# Patient Record
Sex: Male | Born: 1935 | Race: White | Hispanic: No
Health system: Southern US, Community
[De-identification: ages and names within clinical notes are randomized; demographics above are authoritative.]

---

## 2020-09-24 ENCOUNTER — Other Ambulatory Visit: Payer: Self-pay

## 2020-09-24 ENCOUNTER — Emergency Department (HOSPITAL_COMMUNITY)
Admission: EM | Admit: 2020-09-24 | Discharge: 2020-09-25 | Disposition: A | Discharge status: Home / Self Care | Payer: Medicare Other | Attending: Emergency Medicine | Admitting: Emergency Medicine

## 2020-09-24 ENCOUNTER — Emergency Department (HOSPITAL_COMMUNITY): Payer: Medicare Other

## 2020-09-24 DIAGNOSIS — U071 COVID-19: Secondary | ICD-10-CM | POA: Insufficient documentation

## 2020-09-24 DIAGNOSIS — S0990XA Unspecified injury of head, initial encounter: Secondary | ICD-10-CM | POA: Insufficient documentation

## 2020-09-24 DIAGNOSIS — L089 Local infection of the skin and subcutaneous tissue, unspecified: Secondary | ICD-10-CM | POA: Insufficient documentation

## 2020-09-24 DIAGNOSIS — X58XXXA Exposure to other specified factors, initial encounter: Secondary | ICD-10-CM | POA: Insufficient documentation

## 2020-09-24 DIAGNOSIS — L03114 Cellulitis of left upper limb: Secondary | ICD-10-CM

## 2020-09-24 DIAGNOSIS — M7989 Other specified soft tissue disorders: Secondary | ICD-10-CM | POA: Diagnosis present

## 2020-09-24 LAB — CBC WITH DIFFERENTIAL/PLATELET
Abs Immature Granulocytes: 0.03 10*3/uL (ref 0.00–0.07)
Basophils Absolute: 0.1 10*3/uL (ref 0.0–0.1)
Basophils Relative: 1 %
Eosinophils Absolute: 0.4 10*3/uL (ref 0.0–0.5)
Eosinophils Relative: 3 %
HCT: 35.1 % — ABNORMAL LOW (ref 39.0–52.0)
Hemoglobin: 11.7 g/dL — ABNORMAL LOW (ref 13.0–17.0)
Immature Granulocytes: 0 %
Lymphocytes Relative: 14 %
Lymphs Abs: 1.7 10*3/uL (ref 0.7–4.0)
MCH: 30.4 pg (ref 26.0–34.0)
MCHC: 33.3 g/dL (ref 30.0–36.0)
MCV: 91.2 fL (ref 80.0–100.0)
Monocytes Absolute: 1.2 10*3/uL — ABNORMAL HIGH (ref 0.1–1.0)
Monocytes Relative: 10 %
Neutro Abs: 8.4 10*3/uL — ABNORMAL HIGH (ref 1.7–7.7)
Neutrophils Relative %: 72 %
Platelets: 222 10*3/uL (ref 150–400)
RBC: 3.85 MIL/uL — ABNORMAL LOW (ref 4.22–5.81)
RDW: 13.8 % (ref 11.5–15.5)
WBC: 11.7 10*3/uL — ABNORMAL HIGH (ref 4.0–10.5)
nRBC: 0 % (ref 0.0–0.2)

## 2020-09-24 LAB — BASIC METABOLIC PANEL
Anion gap: 10 (ref 5–15)
BUN: 19 mg/dL (ref 8–23)
CO2: 26 mmol/L (ref 22–32)
Calcium: 8.5 mg/dL — ABNORMAL LOW (ref 8.9–10.3)
Chloride: 102 mmol/L (ref 98–111)
Creatinine, Ser: 0.86 mg/dL (ref 0.61–1.24)
GFR, Estimated: >60 mL/min (ref >60)
Glucose, Bld: 136 mg/dL — ABNORMAL HIGH (ref 70–99)
Potassium: 3.6 mmol/L (ref 3.5–5.1)
Sodium: 138 mmol/L (ref 135–145)

## 2020-09-24 LAB — RESP PANEL BY RT-PCR (FLU A&B, COVID) ARPGX2
Influenza A by PCR: NEGATIVE
Influenza B by PCR: NEGATIVE
SARS Coronavirus 2 by RT PCR: POSITIVE — AB

## 2020-09-24 MED ORDER — LIDOCAINE HCL (PF) 1 % IJ SOLN
5.0000 mL | Freq: Once | INTRAMUSCULAR | Status: AC
  Administered 2020-09-24: 5 mL
  Filled 2020-09-24: qty 5

## 2020-09-24 MED ORDER — METOPROLOL TARTRATE 25 MG PO TABS: 25.0000 mg | ORAL_TABLET | Freq: Once | ORAL | Status: DC

## 2020-09-24 MED ORDER — LORAZEPAM 1 MG PO TABS
1.0000 mg | ORAL_TABLET | Freq: Once | ORAL | Status: AC
  Administered 2020-09-24: 1 mg via ORAL
  Filled 2020-09-24: qty 1

## 2020-09-24 MED ORDER — SULFAMETHOXAZOLE-TRIMETHOPRIM 800-160 MG PO TABS
1.0000 | ORAL_TABLET | Freq: Once | ORAL | Status: AC
  Administered 2020-09-24: 1 via ORAL
  Filled 2020-09-24: qty 1

## 2020-09-24 MED ORDER — HALOPERIDOL LACTATE 5 MG/ML IJ SOLN: 5.0000 mg | Freq: Once | INTRAMUSCULAR | Status: DC

## 2020-09-24 MED ORDER — SULFAMETHOXAZOLE-TRIMETHOPRIM 800-160 MG PO TABS: 1.0000 | ORAL_TABLET | Freq: Two times a day (BID) | ORAL | 0 refills | Status: AC

## 2020-09-24 MED ORDER — BACITRACIN ZINC 500 UNIT/GM EX OINT: 1.0000 "application " | TOPICAL_OINTMENT | Freq: Two times a day (BID) | CUTANEOUS | 0 refills | Status: AC

## 2020-09-24 NOTE — Discharge Instructions (Signed)
You have been seen and discharged from the emergency department.  You appear to have an infection of the right hand.  The infection area was drained.  Take antibiotic twice a day as directed.  Keep the wound dry for the next 24 hours, following this the wounds may get wet and be patted dry but should remained dressed with antibiotic ointment.  Follow-up with your primary provider for reevaluation.  Orthopedics evaluated your hand while you were here, I have provided their contact information for follow-up.  Take home medications as prescribed. If you have any worsening symptoms, worsening hand swelling, redness that tracks up the right arm, high fevers or further concerns for health please return to an emergency department for further evaluation.

## 2020-09-24 NOTE — ED Provider Notes (Signed)
[facility] EMERGENCY DEPARTMENT Provider Note   CSN: [id number] Arrival date & time: 09/24/20  1402     History No chief complaint on file.   Richard Ford is a 87 y.o. male.  Redness swelling and pain to the left hand worsening over the last 2 to 3 days.  Thought to start as a bug bite.  Denies fevers chills.  Denies significant pain but does have pain with range of motion.  Has been draining spontaneously.  He is tried no medications to help.        No past medical history on file.  There are no problems to display for this patient.        No family history on file.     Home Medications Prior to Admission medications   Medication Sig Start Date End Date Taking? Authorizing Provider  bacitracin ointment Apply 1 application topically 2 (two) times daily. 09/24/20  Yes Horton, Clabe Seal, DO  sulfamethoxazole-trimethoprim (BACTRIM DS) 800-160 MG tablet Take 1 tablet by mouth 2 (two) times daily for 7 days. 09/24/20 10/01/20 Yes Horton, Clabe Seal, DO  acetaminophen (TYLENOL) 325 MG tablet Take 650 mg by mouth.    [provider]  aspirin 81 MG chewable tablet Chew 81 mg by mouth daily.    [provider]  atorvastatin (LIPITOR) 40 MG tablet Take 40 mg by mouth daily.    [provider]  cephALEXin (KEFLEX) 500 MG capsule Take 500 mg by mouth 2 (two) times daily.    [provider]  clopidogrel (PLAVIX) 75 MG tablet Take 75 mg by mouth daily.    [provider]  donepezil (ARICEPT) 10 MG tablet Take 10 mg by mouth.    [provider]  finasteride (PROSCAR) 5 MG tablet Take 5 mg by mouth daily.    [provider]  fluticasone (FLONASE) 50 MCG/ACT nasal spray Place into both nostrils.    [provider]  hydrochlorothiazide (HYDRODIURIL) 25 MG tablet Take 25 mg by mouth daily.    [provider]  Lactobacillus (ACIDOPHILUS PROBIOTIC PO) Take 1 tablet by mouth 2 (two) times  daily.    [provider]  loratadine (CLARITIN) 10 MG tablet Take 10 mg by mouth daily.    [provider]  melatonin 3 MG TABS tablet Take 3 mg by mouth at bedtime.    [provider]  metoprolol succinate (TOPROL-XL) 25 MG 24 hr tablet Take 25 mg by mouth at bedtime.    [provider]  polyvinyl alcohol (LIQUIFILM TEARS) 1.4 % ophthalmic solution Place 1 drop into both eyes 2 (two) times daily.    [provider]  sertraline (ZOLOFT) 100 MG tablet Take 100 mg by mouth daily.    [provider]  sodium chloride (OCEAN) 0.65 % SOLN nasal spray Place 2 sprays into both nostrils daily.    [provider]  tamsulosin (FLOMAX) 0.4 MG CAPS capsule Take 0.4 mg by mouth.    [provider]  urea (CARMOL) 20 % cream Apply topically.    [provider]    Allergies    Tetracycline  Review of Systems   Review of Systems  Constitutional: Negative for chills and fever.  HENT: Negative for congestion and rhinorrhea.   Respiratory: Negative for cough and shortness of breath.   Cardiovascular: Negative for chest pain and palpitations.  Gastrointestinal: Negative for diarrhea, nausea and vomiting.  Genitourinary: Negative for difficulty urinating and dysuria.  Musculoskeletal: Positive  for arthralgias and joint swelling. Negative for back pain.  Skin: Positive for color change and wound. Negative for rash.  Neurological: Negative for light-headedness and headaches.    Physical Exam Updated Vital Signs BP (!) 186/88   Pulse 63   Temp 98.2 F (36.8 C) (Oral)   Resp 16   SpO2 95%   Physical Exam Vitals and nursing note reviewed.  Constitutional:      General: He is not in acute distress.    Appearance: Normal appearance.  HENT:     Head: Normocephalic and atraumatic.     Nose: No rhinorrhea.  Eyes:     General:        Right eye: No discharge.        Left eye: No discharge.     Conjunctiva/sclera:  Conjunctivae normal.  Cardiovascular:     Rate and Rhythm: Normal rate and regular rhythm.  Pulmonary:     Effort: Pulmonary effort is normal.     Breath sounds: No stridor.  Abdominal:     General: Abdomen is flat. There is no distension.     Palpations: Abdomen is soft.  Musculoskeletal:        General: Swelling and tenderness present. No deformity or signs of injury.     Comments: Significant redness and swelling about the base of the third digit on the left hand.  Redness tracking into the palmar aspect of the palm and dorsal aspect of the hand.  Tenderness to palpation fingers held in slight flexion.  Purulent drainage from the dorsum of the proximal phalanx of the left finger.  Tenderness with palpation along the flexor sheath  Skin:    General: Skin is warm and dry.  Neurological:     General: No focal deficit present.     Mental Status: He is alert. Mental status is at baseline.     Motor: No weakness.  Psychiatric:        Mood and Affect: Mood normal.        Behavior: Behavior normal.        Thought Content: Thought content normal.     ED Results / Procedures / Treatments   Labs (all labs ordered are listed, but only abnormal results are displayed) Labs Reviewed  RESP PANEL BY RT-PCR (FLU A&B, COVID) ARPGX2 - Abnormal; Notable for the following components:      Result Value   SARS Coronavirus 2 by RT PCR POSITIVE (*)    All other components within normal limits  CBC WITH DIFFERENTIAL/PLATELET - Abnormal; Notable for the following components:   WBC 11.7 (*)    RBC 3.85 (*)    Hemoglobin 11.7 (*)    HCT 35.1 (*)    Neutro Abs 8.4 (*)    Monocytes Absolute 1.2 (*)    All other components within normal limits  BASIC METABOLIC PANEL - Abnormal; Notable for the following components:   Glucose, Bld 136 (*)    Calcium 8.5 (*)    All other components within normal limits  AEROBIC CULTURE W GRAM STAIN (SUPERFICIAL SPECIMEN)    EKG None  Radiology No results  found.  Procedures Procedures   Medications Ordered in ED Medications  lidocaine (PF) (XYLOCAINE) 1 % injection 5 mL (5 mLs Infiltration Given by Other 09/24/20 1748)  sulfamethoxazole-trimethoprim (BACTRIM DS) 800-160 MG per tablet 1 tablet (1 tablet Oral Given 09/24/20 1909)  LORazepam (ATIVAN) tablet 1 mg (1 mg Oral Given 09/24/20 2140)  ziprasidone (GEODON) injection 10 mg (10  mg Intramuscular Given 09/25/20 0329)  sterile water (preservative free) injection (  Given by Other 09/25/20 0350)  hydrALAZINE (APRESOLINE) tablet 10 mg (10 mg Oral Given 09/25/20 6384)    ED Course  I have reviewed the triage vital signs and the nursing notes.  Pertinent labs & imaging results that were available during my care of the patient were reviewed by me and considered in my medical decision making (see chart for details).    MDM Rules/Calculators/A&P                          Concern for abscess versus cellulitis versus flexor tenosynovitis.  Orthopedics consulted.  X-ray reviewed by myself and radiology shows no considerable pathology.  Patient's labs are still pending.  Awaiting orthopedic consultation  Pt care was handed off to on coming provider at 1500.  Complete history and physical and current plan have been communicated.  Please refer to their note for the remainder of ED care and ultimate disposition.  Pt seen in conjunction with Dr. Wilkie Aye  Final Clinical Impression(s) / ED Diagnoses Final diagnoses:  Cellulitis of left upper extremity  COVID-19    Rx / DC Orders ED Discharge Orders         Ordered    sulfamethoxazole-trimethoprim (BACTRIM DS) 800-160 MG tablet  2 times daily        09/24/20 2209    bacitracin ointment  2 times daily        09/24/20 2209           Sabino Donovan, MD 09/27/20 1436

## 2020-09-24 NOTE — Consult Note (Addendum)
Reason for Consult:Left hand infection Referring Physician: Acey Lav Time called: 1443 Time at bedside:    Richard Ford is an 87 y.o. male.  HPI: Richard Ford comes in with a 3d hx/o finger swelling, redness, and pain. He thought he may have gotten bitten by something. He said he had something a month ago that someone "dug into". He does have some dementia so not sure how much of history is accurate. He denies fevers, chills, sweats, N/V, or prior e/o. He lives at a SNF and is RHD. As an aside he told me fell and hit his head in the shower about a month ago with a LOC and has been having HA more frequently ever since.  No past medical history on file.  No family history on file.  Social History:  has no history on file for tobacco use, alcohol use, and drug use.  Allergies: Not on File  Medications: I have reviewed the patient's current medications.  No results found for this or any previous visit (from the past 48 hour(s)).  No results found.  Review of Systems  Constitutional: Negative for chills, diaphoresis and fever.  HENT: Negative for ear discharge, ear pain, hearing loss and tinnitus.   Eyes: Negative for photophobia and pain.  Respiratory: Negative for cough and shortness of breath.   Cardiovascular: Negative for chest pain.  Gastrointestinal: Negative for abdominal pain, nausea and vomiting.  Genitourinary: Negative for dysuria, flank pain, frequency and urgency.  Musculoskeletal: Positive for arthralgias (Left hand). Negative for back pain, myalgias and neck pain.  Neurological: Negative for dizziness and headaches.  Hematological: Does not bruise/bleed easily.  Psychiatric/Behavioral: The patient is not nervous/anxious.    Blood pressure (!) 167/75, pulse (!) 58, temperature 98.2 F (36.8 C), temperature source Oral, resp. rate 16, SpO2 100 %. Physical Exam Constitutional:      General: He is not in acute distress.    Appearance: He is well-developed and  well-nourished. He is not diaphoretic.  HENT:     Head: Normocephalic and atraumatic.  Eyes:     General: No scleral icterus.       Right eye: No discharge.        Left eye: No discharge.     Conjunctiva/sclera: Conjunctivae normal.  Cardiovascular:     Rate and Rhythm: Normal rate and regular rhythm.  Pulmonary:     Effort: Pulmonary effort is normal. No respiratory distress.  Musculoskeletal:     Cervical back: Normal range of motion.     Comments: Left shoulder, elbow, wrist, digits- no skin wounds, nontender, no instability, no blocks to motion  Sens  Ax/R/M/U intact  Mot   Ax/ R/ PIN/ M/ AIN/ U intact  Rad 2+  Skin:    General: Skin is warm and dry.  Neurological:     Mental Status: He is alert.  Psychiatric:        Mood and Affect: Mood and affect normal.        Behavior: Behavior normal.     Assessment/Plan: Left hand infection -- I do not think he has tenosynovitis. Will ask EDPA to I&D at bedside. Can either go home with abx and f/u with Dr. Roney Mans as OP vs admission if EDP thinks that's needed. ?TBI -- May need head CT to r/o mass lesion, will discuss with EDP.    Freeman Caldron, PA-C Orthopedic Surgery [phone] 09/24/2020, 2:58 PM

## 2020-09-24 NOTE — ED Triage Notes (Signed)
Pt brought to ED via EMS from [facility] for left hand swelling following bug bite x 3 days ago. Hx dementia, A&O x 2 at baseline. Pt alert on arrival to ED, redness and swelling present to left hand.  EMS v/s: 116/90 48 pulse 18 RR 95% on room air 97.6 temp

## 2020-09-24 NOTE — ED Notes (Signed)
Pt got out of bed twice and pt said he wanted to see the police so security was called to help settle pt down and get pt back in bed. Pt wanted the door open and it was explained to him that he has covid, which pt denies and isn't understanding d/t dementia. Notified MD, order for ativan obtained. Posey alarm placed on pt bed for safety. TV turned on to help distract pt from what he says are people talking about him in the hallway. Rounding increased. Urinal at bedside, water at bedside, call light at bedside.

## 2020-09-24 NOTE — ED Provider Notes (Signed)
87 year old male presents the emergency department with concern for right hand redness and swelling.  Patient received in signout, please refer to previous physician's note for full history/physical exam.  Orthopedics was consulted for concern of hand swelling and infection. Physical Exam  BP (!) 158/99   Pulse (!) 57   Temp 98.2 F (36.8 C) (Oral)   Resp 20   SpO2 99%   Physical Exam Vitals and nursing note reviewed.  Constitutional:      Appearance: Normal appearance.  HENT:     Head: Normocephalic.     Mouth/Throat:     Mouth: Mucous membranes are moist.  Pulmonary:     Effort: Pulmonary effort is normal. No respiratory distress.  Musculoskeletal:     Comments: The proximal right middle finger on the dorsal aspect has a swollen, red and fluctuant area with some purulent drainage, swelling extends to the second through third knuckle with some mild edema and redness of the posterior part of the right hand, no significant findings on the palmar aspect, normal capillary refill and pulses in the right hand.  Skin:    General: Skin is warm.  Neurological:     Mental Status: He is alert and oriented to person, place, and time. Mental status is at baseline.  Psychiatric:        Mood and Affect: Mood normal.     ED Course/Procedures     Procedures  MDM  Orthopedics has evaluated the patient.  They have no concern for tenosynovitis or anything surgical at this time.  Patient is afebrile, blood work shows a mild leukocytosis but no findings of severe infection/sepsis.  Orthopedics recommended I&D of the posterior part of the right middle finger, this was performed, got moderate amount of purulent and serosanguineous drainage.  This also decrease the swelling over the knuckles.  Of note patient was found to be Covid positive.  He has no symptoms in regards to this.  Denies any chest pain, shortness of breath.  He has no hypoxia or respiratory distress.  Since he has had no symptoms unclear  of what his course would be in the illness.  We will give him outpatient follow-up.  He is not acutely ill or requiring emergent treatment/admission.  In regards to the hand I believe antibiotics and outpatient follow-up is appropriate given the improvement with the I&D and his lack of findings in regards to sepsis.  Patient does have dementia, he has started sundowning, did require 1 dose of Ativan for agitation but as of now will be discharged back to his facility.       Rozelle Logan, DO 09/24/20 2235

## 2020-09-24 NOTE — ED Notes (Signed)
Pt resting comfortably in the bed at this time

## 2020-09-24 NOTE — ED Notes (Signed)
Patient transported to x-ray. ?

## 2020-09-25 DIAGNOSIS — L089 Local infection of the skin and subcutaneous tissue, unspecified: Secondary | ICD-10-CM | POA: Diagnosis not present

## 2020-09-25 MED ORDER — HYDRALAZINE HCL 10 MG PO TABS
10.0000 mg | ORAL_TABLET | Freq: Once | ORAL | Status: AC
  Administered 2020-09-25: 10 mg via ORAL
  Filled 2020-09-25: qty 1

## 2020-09-25 MED ORDER — HYDROCHLOROTHIAZIDE 25 MG PO TABS
25.0000 mg | ORAL_TABLET | Freq: Every day | ORAL | Status: DC
  Administered 2020-09-25: 25 mg via ORAL
  Filled 2020-09-25 (×2): qty 1

## 2020-09-25 MED ORDER — ZIPRASIDONE MESYLATE 20 MG IM SOLR
10.0000 mg | Freq: Once | INTRAMUSCULAR | Status: AC
  Administered 2020-09-25: 10 mg via INTRAMUSCULAR
  Filled 2020-09-25: qty 20

## 2020-09-25 MED ORDER — STERILE WATER FOR INJECTION IJ SOLN
INTRAMUSCULAR | Status: AC
  Filled 2020-09-25: qty 20

## 2020-09-25 NOTE — ED Notes (Signed)
Pt changed, brief replaced.

## 2020-09-25 NOTE — ED Notes (Signed)
Pt discharged with PT[facility] back to [facility] facility.

## 2020-09-27 LAB — AEROBIC CULTURE W GRAM STAIN (SUPERFICIAL SPECIMEN): Gram Stain: NONE SEEN

## 2020-11-11 ENCOUNTER — Emergency Department (HOSPITAL_COMMUNITY): Payer: Medicare Other

## 2020-11-11 ENCOUNTER — Emergency Department (HOSPITAL_COMMUNITY)
Admission: EM | Admit: 2020-11-11 | Discharge: 2020-11-11 | Disposition: A | Discharge status: Home / Self Care | Payer: Medicare Other | Attending: Emergency Medicine | Admitting: Emergency Medicine

## 2020-11-11 DIAGNOSIS — F0390 Unspecified dementia without behavioral disturbance: Secondary | ICD-10-CM | POA: Insufficient documentation

## 2020-11-11 DIAGNOSIS — R410 Disorientation, unspecified: Secondary | ICD-10-CM | POA: Insufficient documentation

## 2020-11-11 DIAGNOSIS — S0990XA Unspecified injury of head, initial encounter: Secondary | ICD-10-CM | POA: Insufficient documentation

## 2020-11-11 DIAGNOSIS — Y92129 Unspecified place in nursing home as the place of occurrence of the external cause: Secondary | ICD-10-CM | POA: Diagnosis not present

## 2020-11-11 DIAGNOSIS — S0081XA Abrasion of other part of head, initial encounter: Secondary | ICD-10-CM | POA: Insufficient documentation

## 2020-11-11 DIAGNOSIS — Z7902 Long term (current) use of antithrombotics/antiplatelets: Secondary | ICD-10-CM | POA: Insufficient documentation

## 2020-11-11 DIAGNOSIS — W19XXXA Unspecified fall, initial encounter: Secondary | ICD-10-CM | POA: Insufficient documentation

## 2020-11-11 NOTE — ED Notes (Addendum)
Pt d/c by MD and is provided w/ d/cancer instructions and transported back to [facility]

## 2020-11-11 NOTE — ED Notes (Signed)
PTAR CALLED BY Hershell Brandl  PT IS NUMBER [phone] ON THE LIST

## 2020-11-11 NOTE — ED Triage Notes (Addendum)
Pt arrives from EMS from [facility] memory care, EMS reports pt was walking without his walker and was found on the floor. EMS reports pt is taking plavix and his mental baseline is confusion. Upon arrival pt has an skin tear above right eyebrow, bleeding controlled, Pt denies any complaints or pain at this time, pt able to move all extremities, Vitals signs stable at this time

## 2020-11-11 NOTE — ED Provider Notes (Signed)
Emergency Department Provider Note   I have reviewed the triage vital signs and the nursing notes.   HISTORY  Chief Complaint Fall   HPI Richard Ford is a 87 y.o. male with past medical history reviewed in his duplicate chart including hypertension and dementia presents to the emergency department from memory care unit after fall.  He is supposed to ambulate with a walker but was not in had a fall reportedly witnessed by staff.  There was no loss of consciousness.  He was noted to have some abrasion over the right eye by EMS but he is at his baseline mental status which is pleasant but confused.  Patient denies any pain.  He cannot provide any additional history regarding the fall. Level 5 caveat: Dementia.    Patient is on Plavix and per protocol was activated as a level 2 trauma by EMS.  No past medical history on file.  There are no problems to display for this patient.  Allergies Patient has no known allergies.  No family history on file.  Social History    Review of Systems  Constitutional: No fever/chills Eyes: No visual changes. ENT: No sore throat. Cardiovascular: Denies chest pain. Respiratory: Denies shortness of breath. Gastrointestinal: No abdominal pain.  No nausea, no vomiting.  No diarrhea.  No constipation. Genitourinary: Negative for dysuria. Musculoskeletal: Negative for back pain. Skin: Negative for rash. Neurological: Negative for headaches, focal weakness or numbness.  10-point ROS otherwise negative.  ____________________________________________   PHYSICAL EXAM:  VITAL SIGNS: ED Triage Vitals  Enc Vitals Group     BP 11/11/20 1621 (!) 162/76     Pulse Rate 11/11/20 1621 65     Resp 11/11/20 1621 20     Temp 11/11/20 1621 98.2 F (36.8 C)     Temp src --      SpO2 11/11/20 1621 97 %     Weight 11/11/20 1621 172 lb (78 kg)     Height 11/11/20 1621 5\' 10"  (1.778 m)   Constitutional: Alert and oriented. Well appearing and in no  acute distress. Eyes: Conjunctivae are normal. Pupils are 1 mm and reactive bilaterally.  Head: Abrasion over the right eyebrow without laceration.  No hematoma. Nose: No congestion/rhinnorhea. Mouth/Throat: Mucous membranes are moist.  Oropharynx non-erythematous. Neck: No stridor.  C collar in place.  Cardiovascular: Normal rate, regular rhythm. Good peripheral circulation. Grossly normal heart sounds.   Respiratory: Normal respiratory effort.  No retractions. Lungs CTAB. Gastrointestinal: Soft and nontender. No distention.  Musculoskeletal: No lower extremity tenderness nor edema. No gross deformities of extremities.  Normal active and passive range of motion in all upper and lower extremities.  Neurologic:  Normal speech and language. No gross focal neurologic deficits are appreciated. 5/5 strength in the bilateral upper and lower extremities. Skin:  Skin is warm and dry.  Abrasion over the right eyebrow which is hemostatic.  No laceration.  ____________________________________________  EKG   EKG Interpretation  Date/Time:  Thursday November 11 2020 16:27:13 EDT Ventricular Rate:  66 PR Interval:  238 QRS Duration: 109 QT Interval:  460 QTC Calculation: 482 R Axis:   6 Text Interpretation: Sinus rhythm Prolonged PR interval Anterior infarct, old Confirmed by [birth year]1981 [phone]) on 11/11/2020 5:34:21 PM       ____________________________________________  RADIOLOGY  CT Head Wo Contrast  Result Date: 11/11/2020 CLINICAL DATA:  Head trauma. EXAM: CT HEAD WITHOUT CONTRAST TECHNIQUE: Contiguous axial images were obtained from the base of the skull through the  vertex without intravenous contrast. COMPARISON:  September 24, 2020 FINDINGS: Brain: No evidence of acute infarction, hemorrhage, hydrocephalus, extra-axial collection or mass lesion/mass effect. Moderate brain parenchymal volume loss and deep white matter microangiopathy. Vascular: Calcific atherosclerotic disease of the intra  cranial arteries. Skull: Normal. Negative for fracture or focal lesion. Sinuses/Orbits: No acute finding. Other: None. IMPRESSION: 1. No acute intracranial abnormality. 2. Moderate brain parenchymal atrophy and chronic microvascular disease. Electronically Signed   By: Ted Mcalpine M.D.   On: 11/11/2020 16:58   CT Cervical Spine Wo Contrast  Result Date: 11/11/2020 CLINICAL DATA:  Tripped and fell, hit head, anticoagulated EXAM: CT CERVICAL SPINE WITHOUT CONTRAST TECHNIQUE: Multidetector CT imaging of the cervical spine was performed without intravenous contrast. Multiplanar CT image reconstructions were also generated. COMPARISON:  None. FINDINGS: Alignment: Loss of normal cervical lordosis due to extensive multilevel spondylosis and facet hypertrophy. Skull base and vertebrae: No acute fracture. No primary bone lesion or focal pathologic process. Soft tissues and spinal canal: No prevertebral fluid or swelling. No visible canal hematoma. Extensive atherosclerosis. Disc levels: There is diffuse spondylosis and facet hypertrophy throughout the entire cervical spine, most pronounced at the C4-5, C5-6, and C6-7 levels. Marked hypertrophic changes at the C1-C2 interface. Upper chest: Airway is patent.  Lung apices are clear. Other: Reconstructed images demonstrate no additional findings. IMPRESSION: 1. Extensive multilevel cervical spondylosis and facet hypertrophy. 2. No acute fracture. 3. Atherosclerosis. Electronically Signed   By: Sharlet Salina M.D.   On: 11/11/2020 16:53   DG Chest Portable 1 View  Result Date: 11/11/2020 CLINICAL DATA:  87 year old who fell and complains of chest pain. EXAM: PORTABLE CHEST 1 VIEW COMPARISON:  None. FINDINGS: Prior sternotomy. Prior TAVR. Cardiac silhouette moderately enlarged for AP portable technique. Thoracic aorta tortuous and mildly atherosclerotic. Hilar and mediastinal contours otherwise unremarkable. Lungs clear. Bronchovascular markings normal. Pulmonary  vascularity normal. No visible pleural effusions. No pneumothorax. Osseous demineralization. Degenerative changes in the shoulders. IMPRESSION: Cardiomegaly. No acute cardiopulmonary disease. Electronically Signed   By: Hulan Saas M.D.   On: 11/11/2020 16:32    ____________________________________________   PROCEDURES  Procedure(s) performed:   Procedures  None  ____________________________________________   INITIAL IMPRESSION / ASSESSMENT AND PLAN / ED COURSE  Pertinent labs & imaging results that were available during my care of the patient were reviewed by me and considered in my medical decision making (see chart for details).   Patient presents to the emergency department for evaluation and with head injury after fall while not using his walker.  He has an abrasion above the right eye but no laceration requiring repair.  He appears to be at his mental status baseline and has equal strength and sensation in the bilateral upper and lower extremities.  Vital signs show hypertension but in his merged chart it appears that he has elevated blood pressures frequently.  CT imaging of the head and cervical spine show chronic microvascular disease but no acute findings such as bleeding or fracture.  Chest x-ray is similar and that it shows chronic cardiomegaly but no acute cardiopulmonary disease.  Plan for patient to be discharged back to his nursing facility.  I attended to contact his family member, Richard Ford, by the number listed in the chart but there was no answer.    ____________________________________________  FINAL CLINICAL IMPRESSION(S) / ED DIAGNOSES  Final diagnoses:  Fall, initial encounter  Injury of head, initial encounter  Abrasion of forehead, initial encounter    Note:  This document was prepared  using Conservation officer, historic buildings and may include unintentional dictation errors.  Alona Bene, MD, [facility] Emergency Medicine    Nikea Settle, Arlyss Repress, MD 11/11/20  1739

## 2020-11-11 NOTE — Discharge Instructions (Signed)
You were seen in the emergency department today after a fall.  The CT scans of your head and neck were normal.  You do have an abrasion over your right eyebrow.  You need to keep this clean and dry.  It did not require stitches or glue.  Please follow with your primary care doctor and return to the emergency department with any sudden confusion, vomiting, other sudden/severe symptoms.

## 2020-11-11 NOTE — ED Notes (Signed)
Patient transported to CT Accompany by TRN

## 2020-11-11 NOTE — ED Notes (Signed)
This RN clean dry blood off patient and place dressing on pt skin tear and provided food and drinks for patient, call bell within reach

## 2020-11-27 ENCOUNTER — Emergency Department (HOSPITAL_COMMUNITY): Payer: Medicare Other

## 2020-11-27 ENCOUNTER — Encounter (HOSPITAL_COMMUNITY): Payer: Self-pay

## 2020-11-27 ENCOUNTER — Emergency Department (HOSPITAL_COMMUNITY)
Admission: EM | Admit: 2020-11-27 | Discharge: 2020-11-27 | Disposition: A | Discharge status: Home / Self Care | Payer: Medicare Other | Attending: Emergency Medicine | Admitting: Emergency Medicine

## 2020-11-27 DIAGNOSIS — S61412A Laceration without foreign body of left hand, initial encounter: Secondary | ICD-10-CM | POA: Insufficient documentation

## 2020-11-27 DIAGNOSIS — F0390 Unspecified dementia without behavioral disturbance: Secondary | ICD-10-CM | POA: Diagnosis not present

## 2020-11-27 DIAGNOSIS — W2209XA Striking against other stationary object, initial encounter: Secondary | ICD-10-CM | POA: Insufficient documentation

## 2020-11-27 DIAGNOSIS — S6992XA Unspecified injury of left wrist, hand and finger(s), initial encounter: Secondary | ICD-10-CM | POA: Diagnosis present

## 2020-11-27 DIAGNOSIS — Z7982 Long term (current) use of aspirin: Secondary | ICD-10-CM | POA: Diagnosis not present

## 2020-11-27 DIAGNOSIS — Z23 Encounter for immunization: Secondary | ICD-10-CM | POA: Insufficient documentation

## 2020-11-27 DIAGNOSIS — Z7902 Long term (current) use of antithrombotics/antiplatelets: Secondary | ICD-10-CM | POA: Insufficient documentation

## 2020-11-27 MED ORDER — TETANUS-DIPHTH-ACELL PERTUSSIS 5-2.5-18.5 LF-MCG/0.5 IM SUSY
0.5000 mL | PREFILLED_SYRINGE | Freq: Once | INTRAMUSCULAR | Status: AC
  Administered 2020-11-27: 0.5 mL via INTRAMUSCULAR
  Filled 2020-11-27: qty 0.5

## 2020-11-27 NOTE — Discharge Instructions (Addendum)
The Steri-Strips should fall off on their own after few days.  Try not to get them wet or peel them off as this will cause the Steri-Strips to fall off sooner. Use Tylenol as needed for pain.  Use ice to help with pain and swelling. Continue taking home medications as prescribed. Your tetanus was updated today in the department. Follow-up with your primary care doctor today for recheck of your symptoms. Return to the emergency room with any new, sudden, concerning symptoms.

## 2020-11-27 NOTE — ED Notes (Signed)
Richard Ford Med tech answered [facility] call; report given to Richard Ford who was made aware pt will be returning.

## 2020-11-27 NOTE — ED Notes (Signed)
Called [facility] to inform staff that patient will be getting ptar transport back to facility with no answer x2 and no ability to leave voicemail.

## 2020-11-27 NOTE — ED Provider Notes (Signed)
[facility]-EMERGENCY DEPT Provider Note   CSN: [id number] Arrival date & time: 11/27/20  1218     History Chief Complaint  Patient presents with  . Wound Check    Richard Ford is a 87 y.o. male presenting for evaluation of left hand injury.  Level 5 caveat due to dementia.  History obtained from triage note.  Per note, patient hit his left hand on something during lunch.  Developed bleeding and had a skin tear in the area.  Per chart review, patient is on Plavix.  No other blood thinners.  Pt denies pain.   HPI     History reviewed. No pertinent past medical history.  There are no problems to display for this patient.   History reviewed. No pertinent surgical history.     History reviewed. No pertinent family history.     Home Medications Prior to Admission medications   Medication Sig Start Date End Date Taking? Authorizing Provider  acetaminophen (TYLENOL) 325 MG tablet Take 650 mg by mouth.    [provider]  aspirin 81 MG chewable tablet Chew 81 mg by mouth daily.    [provider]  atorvastatin (LIPITOR) 40 MG tablet Take 40 mg by mouth daily.    [provider]  bacitracin ointment Apply 1 application topically 2 (two) times daily. 09/24/20   Horton, Clabe Seal, DO  cephALEXin (KEFLEX) 500 MG capsule Take 500 mg by mouth 2 (two) times daily.    [provider]  clopidogrel (PLAVIX) 75 MG tablet Take 75 mg by mouth daily.    [provider]  donepezil (ARICEPT) 10 MG tablet Take 10 mg by mouth.    [provider]  finasteride (PROSCAR) 5 MG tablet Take 5 mg by mouth daily.    [provider]  fluticasone (FLONASE) 50 MCG/ACT nasal spray Place into both nostrils.    [provider]  hydrochlorothiazide (HYDRODIURIL) 25 MG tablet Take 25 mg by mouth daily.    [provider]  Lactobacillus (ACIDOPHILUS PROBIOTIC PO) Take 1 tablet by mouth 2 (two) times daily.     [provider]  loratadine (CLARITIN) 10 MG tablet Take 10 mg by mouth daily.    [provider]  melatonin 3 MG TABS tablet Take 3 mg by mouth at bedtime.    [provider]  metoprolol succinate (TOPROL-XL) 25 MG 24 hr tablet Take 25 mg by mouth at bedtime.    [provider]  polyvinyl alcohol (LIQUIFILM TEARS) 1.4 % ophthalmic solution Place 1 drop into both eyes 2 (two) times daily.    [provider]  sertraline (ZOLOFT) 100 MG tablet Take 100 mg by mouth daily.    [provider]  sodium chloride (OCEAN) 0.65 % SOLN nasal spray Place 2 sprays into both nostrils daily.    [provider]  tamsulosin (FLOMAX) 0.4 MG CAPS capsule Take 0.4 mg by mouth.    [provider]  urea (CARMOL) 20 % cream Apply topically.    [provider]    Allergies    Tetracycline  Review of Systems   Review of Systems  Skin: Positive for wound.  Hematological: Bruises/bleeds easily.    Physical Exam Updated Vital Signs BP (!) 151/78   Pulse (!) 50   Temp 98.3 F (36.8 C) (Oral)   Resp 14   Ht 5\' 9"  (1.753 m)   Wt 79.4 kg   SpO2 97%   BMI 25.84 kg/m  Physical Exam Vitals and nursing note reviewed.  Constitutional:      General: He is not in acute distress.    Appearance: He is well-developed.     Comments: Resting in the bed in no acute distress  HENT:     Head: Normocephalic and atraumatic.  Eyes:     Conjunctiva/sclera: Conjunctivae normal.     Pupils: Pupils are equal, round, and reactive to light.  Cardiovascular:     Rate and Rhythm: Normal rate and regular rhythm.     Pulses: Normal pulses.  Pulmonary:     Effort: Pulmonary effort is normal. No respiratory distress.     Breath sounds: Normal breath sounds. No wheezing.  Abdominal:     General: There is no distension.     Palpations: Abdomen is soft. There is no mass.     Tenderness: There is no abdominal tenderness. There is no guarding or  rebound.  Musculoskeletal:        General: Normal range of motion.     Cervical back: Normal range of motion and neck supple.     Comments: Skin tear of the dorsal left hand without active bleeding. underlying hematoma. Full active range of motion of the wrist and fingers.  Good distal sensation and cap refill of all fingers.  Skin:    General: Skin is warm and dry.     Capillary Refill: Capillary refill takes less than 2 seconds.  Neurological:     Mental Status: He is alert and oriented to person, place, and time.        ED Results / Procedures / Treatments   Labs (all labs ordered are listed, but only abnormal results are displayed) Labs Reviewed - No data to display  EKG None  Radiology DG Hand Complete Left  Result Date: 11/27/2020 CLINICAL DATA:  Skin tear on the dorsal aspect of the left hand. EXAM: LEFT HAND - COMPLETE 3+ VIEW COMPARISON:  09/24/2020 FINDINGS: Again noted are diffuse degenerative changes throughout the left hand without acute fracture or dislocation. Severe osteoarthritis at the first carpometacarpal joint and the fourth MCP joint. Ulnar positive variance and similar to the previous examination. Diffuse vascular calcifications. 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  No acute abnormality. Electronically Signed   By: Richarda Overlie M.D.   On: 11/27/2020 13:46    Procedures .Marland KitchenLaceration Repair  Date/Time: 11/27/2020 2:04 PM Performed by: Wynetta Fines, MD Authorized by: Alveria Apley, PA-C   Consent:    Consent obtained:  Verbal   Consent given by:  Patient Anesthesia:    Anesthesia method:  None Treatment:    Area cleansed with:  Soap and water Skin repair:    Repair method:  Steri-Strips   Number of Steri-Strips:  5 Approximation:    Approximation:  Close Repair type:    Repair type:  Simple Post-procedure details:    Dressing:  Sterile dressing   Procedure completion:  Tolerated well, no immediate complications      Medications Ordered in ED Medications  Tdap (BOOSTRIX) injection 0.5 mL (has no administration in time range)    ED Course  I have reviewed the triage vital signs and the nursing notes.  Pertinent labs & imaging results that were available during my care of the patient were reviewed by me and considered in my medical decision making (see chart for details).    MDM Rules/Calculators/A&P  Patient presented for evaluation of hand injury.  On exam, patient appears neurovascularly intact.  Due to his history of dementia and unreliable H&P, will obtain x-ray to ensure no bony abnormality.  If negative, plan for repair of skin tear with Steri-Strips.  I attempted to call the facility for more information about why patient was sent over, but was unable to get in touch with the staff that cares for the patient.  X-ray viewed and independently interpreted by me, no fracture or dislocation.  Laceration repaired as described above.  Tetanus updated in the department.  At this time, patient present for discharge.  Return precautions given.  Patient states he understands and agrees to plan.  Final Clinical Impression(s) / ED Diagnoses Final diagnoses:  Skin tear of left hand without complication, initial encounter    Rx / DC Orders ED Discharge Orders    None       Alveria Apley, PA-C 11/27/20 1406    Wynetta Fines, MD 11/27/20 1419

## 2020-11-27 NOTE — ED Notes (Signed)
Called [facility] nursing facility. Spoke to Norco and informed her patient is on the way.

## 2020-11-27 NOTE — ED Notes (Signed)
Provided food and drink. Awaiting ptar transport

## 2020-11-27 NOTE — ED Triage Notes (Addendum)
Sent by EMS from [facility] care. As per EMS pt was in lunch room and hit his L hand on "something". Skin tear found on L hand bleeding controlled by EMS. As per EMS pt was agitated at facility, given Xanex at 1130. Pt calm and cooperative at this time.

## 2020-11-27 NOTE — ED Notes (Signed)
ptar contacted for pt transport back to [facility].

## 2020-11-27 NOTE — ED Notes (Addendum)
Patient attempting to get out of bed 4 times since shift change at 1900, despite verbal deescalating technique. The 4th time the patient made it so his legs can dangle off the bed. Bed alarm is on, volume turned up all the way. Dr. Adela Lank put in order for posey belt.

## 2020-12-01 ENCOUNTER — Other Ambulatory Visit: Payer: Self-pay

## 2020-12-01 ENCOUNTER — Emergency Department (HOSPITAL_COMMUNITY): Payer: Medicare Other

## 2020-12-01 ENCOUNTER — Emergency Department (HOSPITAL_COMMUNITY)
Admission: EM | Admit: 2020-12-01 | Discharge: 2020-12-01 | Disposition: A | Discharge status: Home / Self Care | Payer: Medicare Other | Attending: Emergency Medicine | Admitting: Emergency Medicine

## 2020-12-01 DIAGNOSIS — W19XXXA Unspecified fall, initial encounter: Secondary | ICD-10-CM

## 2020-12-01 DIAGNOSIS — F0390 Unspecified dementia without behavioral disturbance: Secondary | ICD-10-CM | POA: Insufficient documentation

## 2020-12-01 DIAGNOSIS — Y92129 Unspecified place in nursing home as the place of occurrence of the external cause: Secondary | ICD-10-CM | POA: Diagnosis not present

## 2020-12-01 DIAGNOSIS — S0990XA Unspecified injury of head, initial encounter: Secondary | ICD-10-CM | POA: Diagnosis present

## 2020-12-01 DIAGNOSIS — T148XXA Other injury of unspecified body region, initial encounter: Secondary | ICD-10-CM

## 2020-12-01 DIAGNOSIS — S0181XA Laceration without foreign body of other part of head, initial encounter: Secondary | ICD-10-CM | POA: Diagnosis not present

## 2020-12-01 LAB — BASIC METABOLIC PANEL
Anion gap: 4 — ABNORMAL LOW (ref 5–15)
BUN: 18 mg/dL (ref 8–23)
CO2: 27 mmol/L (ref 22–32)
Calcium: 8.5 mg/dL — ABNORMAL LOW (ref 8.9–10.3)
Chloride: 107 mmol/L (ref 98–111)
Creatinine, Ser: 0.7 mg/dL (ref 0.61–1.24)
GFR, Estimated: >60 mL/min (ref >60)
Glucose, Bld: 138 mg/dL — ABNORMAL HIGH (ref 70–99)
Potassium: 3.4 mmol/L — ABNORMAL LOW (ref 3.5–5.1)
Sodium: 138 mmol/L (ref 135–145)

## 2020-12-01 LAB — CBC WITH DIFFERENTIAL/PLATELET
Abs Immature Granulocytes: 0.04 10*3/uL (ref 0.00–0.07)
Basophils Absolute: 0 10*3/uL (ref 0.0–0.1)
Basophils Relative: 1 %
Eosinophils Absolute: 0.5 10*3/uL (ref 0.0–0.5)
Eosinophils Relative: 7 %
HCT: 37.7 % — ABNORMAL LOW (ref 39.0–52.0)
Hemoglobin: 12 g/dL — ABNORMAL LOW (ref 13.0–17.0)
Immature Granulocytes: 1 %
Lymphocytes Relative: 18 %
Lymphs Abs: 1.4 10*3/uL (ref 0.7–4.0)
MCH: 29 pg (ref 26.0–34.0)
MCHC: 31.8 g/dL (ref 30.0–36.0)
MCV: 91.1 fL (ref 80.0–100.0)
Monocytes Absolute: 0.7 10*3/uL (ref 0.1–1.0)
Monocytes Relative: 9 %
Neutro Abs: 4.9 10*3/uL (ref 1.7–7.7)
Neutrophils Relative %: 64 %
Platelets: 220 10*3/uL (ref 150–400)
RBC: 4.14 MIL/uL — ABNORMAL LOW (ref 4.22–5.81)
RDW: 15.4 % (ref 11.5–15.5)
WBC: 7.6 10*3/uL (ref 4.0–10.5)
nRBC: 0 % (ref 0.0–0.2)

## 2020-12-01 MED ORDER — POTASSIUM CHLORIDE CRYS ER 20 MEQ PO TBCR
40.0000 meq | EXTENDED_RELEASE_TABLET | Freq: Once | ORAL | Status: AC
  Administered 2020-12-01: 40 meq via ORAL
  Filled 2020-12-01: qty 2

## 2020-12-01 MED ORDER — HYDROCHLOROTHIAZIDE 25 MG PO TABS
25.0000 mg | ORAL_TABLET | Freq: Once | ORAL | Status: AC
  Administered 2020-12-01: 25 mg via ORAL
  Filled 2020-12-01: qty 1

## 2020-12-01 NOTE — ED Provider Notes (Signed)
Care transferred to me.  Labs including anemia are near baseline.  We will replete potassium.  Head CT and cervical spine CT are benign.  Will discharge back to facility.  I tried to call his daughter but it went straight to voicemail.`   Pricilla Loveless, MD 12/01/20 0830

## 2020-12-01 NOTE — ED Notes (Signed)
Wound cleanse, dressed. Xeroform placed with verbal order from Dr. Pricilla Loveless.

## 2020-12-01 NOTE — ED Provider Notes (Signed)
[facility]  CSN: [id number] Arrival date & time: 12/01/20 [id number]  Chief Complaint(s) Level II Fall   HPI Helane RimaRaymond Bertz is a 87 y.o. male here from [facility] for fall from his wheelchair. + head trauma. No LOC. On Plavix. H/o dementia. At baseline. Complains of frontal headache. Denies any other pain.  Remainder of history, ROS, and physical exam limited due to patient's condition (dementia). Additional information was obtained from EMS.   Level V Caveat.    HPI  Past Medical History No past medical history on file. There are no problems to display for this patient.  Home Medication(s) Prior to Admission medications   Medication Sig Start Date End Date Taking? Authorizing Provider  acetaminophen (TYLENOL) 325 MG tablet Take 650 mg by mouth every 4 (four) hours as needed for moderate pain or headache.   Yes [provider]  ALPRAZolam (XANAX) 0.25 MG tablet Take 0.125 mg by mouth every 4 (four) hours as needed for anxiety.   Yes [provider]  atorvastatin (LIPITOR) 40 MG tablet Take 40 mg by mouth daily.   Yes [provider]  clopidogrel (PLAVIX) 75 MG tablet Take 75 mg by mouth daily.   Yes [provider]  finasteride (PROSCAR) 5 MG tablet Take 5 mg by mouth daily.   Yes [provider]  fluticasone (FLONASE) 50 MCG/ACT nasal spray Place 2 sprays into both nostrils daily.   Yes [provider]  hydrochlorothiazide (HYDRODIURIL) 25 MG tablet Take 25 mg by mouth daily.   Yes [provider]  hydroxypropyl methylcellulose / hypromellose (ISOPTO TEARS / GONIOVISC) 2.5 % ophthalmic solution Place 1 drop into both eyes in the morning and at bedtime.   Yes [provider]  loperamide (IMODIUM) 2 MG capsule Take 2 mg by mouth as needed for diarrhea or loose stools.   Yes [provider]  loratadine (CLARITIN) 10 MG tablet Take 10 mg by mouth daily.   Yes  [provider]  LORazepam (ATIVAN) 1 MG tablet Take 1 mg by mouth every 4 (four) hours as needed for anxiety.   Yes [provider]  memantine (NAMENDA) 5 MG tablet Take 5 mg by mouth 2 (two) times daily.   Yes [provider]  metoprolol succinate (TOPROL-XL) 25 MG 24 hr tablet Take 25 mg by mouth at bedtime. Hold if pulse is less than 50   Yes [provider]  QUEtiapine (SEROQUEL) 25 MG tablet Take 25 mg by mouth at bedtime.   Yes [provider]  QUEtiapine (SEROQUEL) 25 MG tablet Take 25 mg by mouth See admin instructions. Give 1 tablet 1 hour after hs dose  up to 2 am as needed for sleep   Yes [provider]  sertraline (ZOLOFT) 100 MG tablet Take 100 mg by mouth daily.   Yes [provider]  sodium chloride (OCEAN) 0.65 % SOLN nasal spray Place 2 sprays into both nostrils daily.   Yes [provider]  tamsulosin (FLOMAX) 0.4 MG CAPS capsule Take 0.4 mg by mouth at bedtime.   Yes [provider]  urea (CARMOL) 20 % cream Apply 1 application topically daily. Apply to soles of  feet   Yes [provider]  divalproex (DEPAKOTE) 250 MG DR tablet Take 250 mg by mouth See admin instructions. Hs x 7 days Patient not taking: No sig reported    [provider]  donepezil (ARICEPT) 5 MG tablet Take 5 mg by  mouth See admin instructions. Hs x 14 days Patient not taking: No sig reported    [provider]  memantine (NAMENDA) 5 MG tablet Take 5 mg by mouth See admin instructions. Qd x 14 days Patient not taking: No sig reported    [provider]                                                                                                                                    Past Surgical History ** The histories are not reviewed yet. Please review them in the "History" navigator section and refresh this SmartLink. Family History No family history on file.  Social History    Allergies Tetracyclines & related  Review of Systems Review of Systems  Unable to perform ROS: Dementia    Physical Exam Vital Signs  I have reviewed the triage vital signs BP (!) 138/92   Pulse 65   Temp 97.7 F (36.5 C) (Oral)   Resp 15   Ht 5\' 9"  (1.753 m)   Wt 77.1 kg   SpO2 96%   BMI 25.10 kg/m   Physical Exam Constitutional:      General: He is not in acute distress.    Appearance: He is well-developed. He is not diaphoretic.     Interventions: Cervical collar in place.  HENT:     Head: Normocephalic.      Right Ear: External ear normal.     Left Ear: External ear normal.  Eyes:     General: No scleral icterus.       Right eye: No discharge.        Left eye: No discharge.     Conjunctiva/sclera: Conjunctivae normal.     Pupils: Pupils are equal, round, and reactive to light.  Cardiovascular:     Rate and Rhythm: Regular rhythm.     Pulses:          Radial pulses are 2+ on the right side and 2+ on the left side.       Dorsalis pedis pulses are 2+ on the right side and 2+ on the left side.     Heart sounds: Normal heart sounds. No murmur heard. No friction rub. No gallop.   Pulmonary:     Effort: Pulmonary effort is normal. No respiratory distress.     Breath sounds: Normal breath sounds. No stridor.  Abdominal:     General: There is no distension.     Palpations: Abdomen is soft.     Tenderness: There is no abdominal tenderness.  Musculoskeletal:       Arms:     Cervical back: Normal range of motion and neck supple. No bony tenderness.     Thoracic back: No bony tenderness.     Lumbar back: No bony tenderness.     Comments: Clavicle stable. Chest stable to AP/Lat compression. Pelvis stable to Lat compression. No obvious extremity deformity. No  chest or abdominal wall contusion.  Skin:    General: Skin is warm.  Neurological:     Mental Status: He is alert and oriented to person, place, and time.     GCS: GCS eye subscore is 4. GCS verbal  subscore is 5. GCS motor subscore is 6.     Comments: Moving all extremities      ED Results and Treatments Labs (all labs ordered are listed, but only abnormal results are displayed) Labs Reviewed  CBC WITH DIFFERENTIAL/PLATELET  BASIC METABOLIC PANEL                                                                                                                         EKG  EKG Interpretation  Date/Time:    Ventricular Rate:    PR Interval:    QRS Duration:   QT Interval:    QTC Calculation:   R Axis:     Text Interpretation:        Radiology No results found.  Pertinent labs & imaging results that were available during my care of the patient were reviewed by me and considered in my medical decision making (see chart for details).  Medications Ordered in ED Medications - No data to display                                                                                                                                  Procedures Procedures  (including critical care time)  Medical Decision Making / ED Course I have reviewed the nursing notes for this encounter and the patient's prior records (if available in EHR or on provided paperwork).   Yonah Tangeman was evaluated in Emergency Department on 12/01/2020 for the symptoms described in the history of present illness. He was evaluated in the context of the global COVID-19 pandemic, which necessitated consideration that the patient might be at risk for infection with the SARS-CoV-2 virus that causes COVID-19. Institutional protocols and algorithms that pertain to the evaluation of patients at risk for COVID-19 are in a state of rapid change based on information released by regulatory bodies including the CDC and federal and state organizations. These policies and algorithms were followed during the patient's care in the ED.  Level II fall on plavix ABC intact Secondary as above Skin tear to forehead. Will get basic labs  and CT head/cervical spine.  Patient care turned over to  Dr Criss Alvine. Patient case and results discussed in detail; please see their note for further ED managment.        Final Clinical Impression(s) / ED Diagnoses Final diagnoses:  Fall at nursing home, initial encounter  Multiple skin tears      This chart was dictated using voice recognition software.  Despite best efforts to proofread,  errors can occur which can change the documentation meaning.   Nira Conn, MD 12/01/20 0700

## 2020-12-01 NOTE — ED Notes (Signed)
Secretary scheduled PTAR ride. Pt given food and drink. Pt repositioned.

## 2020-12-01 NOTE — ED Triage Notes (Signed)
BIB [facility] from [facility]. Pt fell out of wheelchair and hit head. Pt has had more recent falls lately. Pt also fell yesterday. Skin tear to right forehead. Pt at mentally at baseline. Hx; Dementia.   IV 20L FA.

## 2020-12-01 NOTE — ED Notes (Signed)
PTAR called  

## 2021-01-28 DEATH — deceased

## 2022-11-18 IMAGING — DX DG HAND COMPLETE 3+V*L*
3 series · 3 of 3 positions shown · non-contrast
Comparison: 09/24/2020

CLINICAL DATA: Skin tear on the dorsal aspect of the left hand.

EXAM:
LEFT HAND - COMPLETE 3+ VIEW

[hand obl]
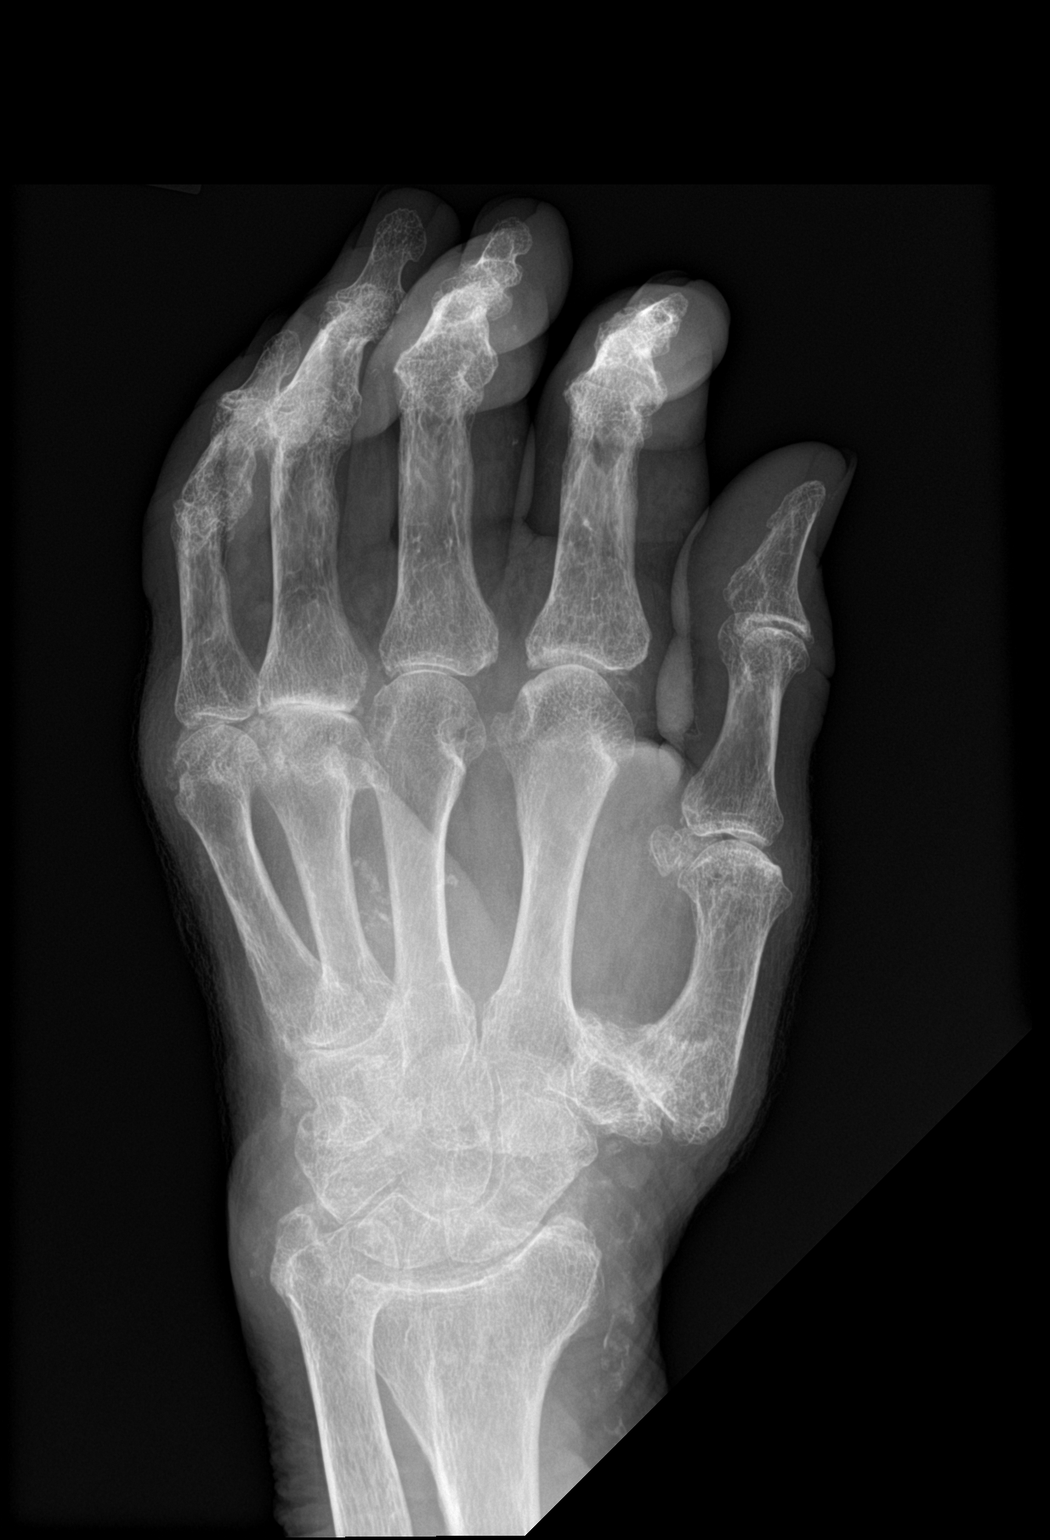

[hand lat]
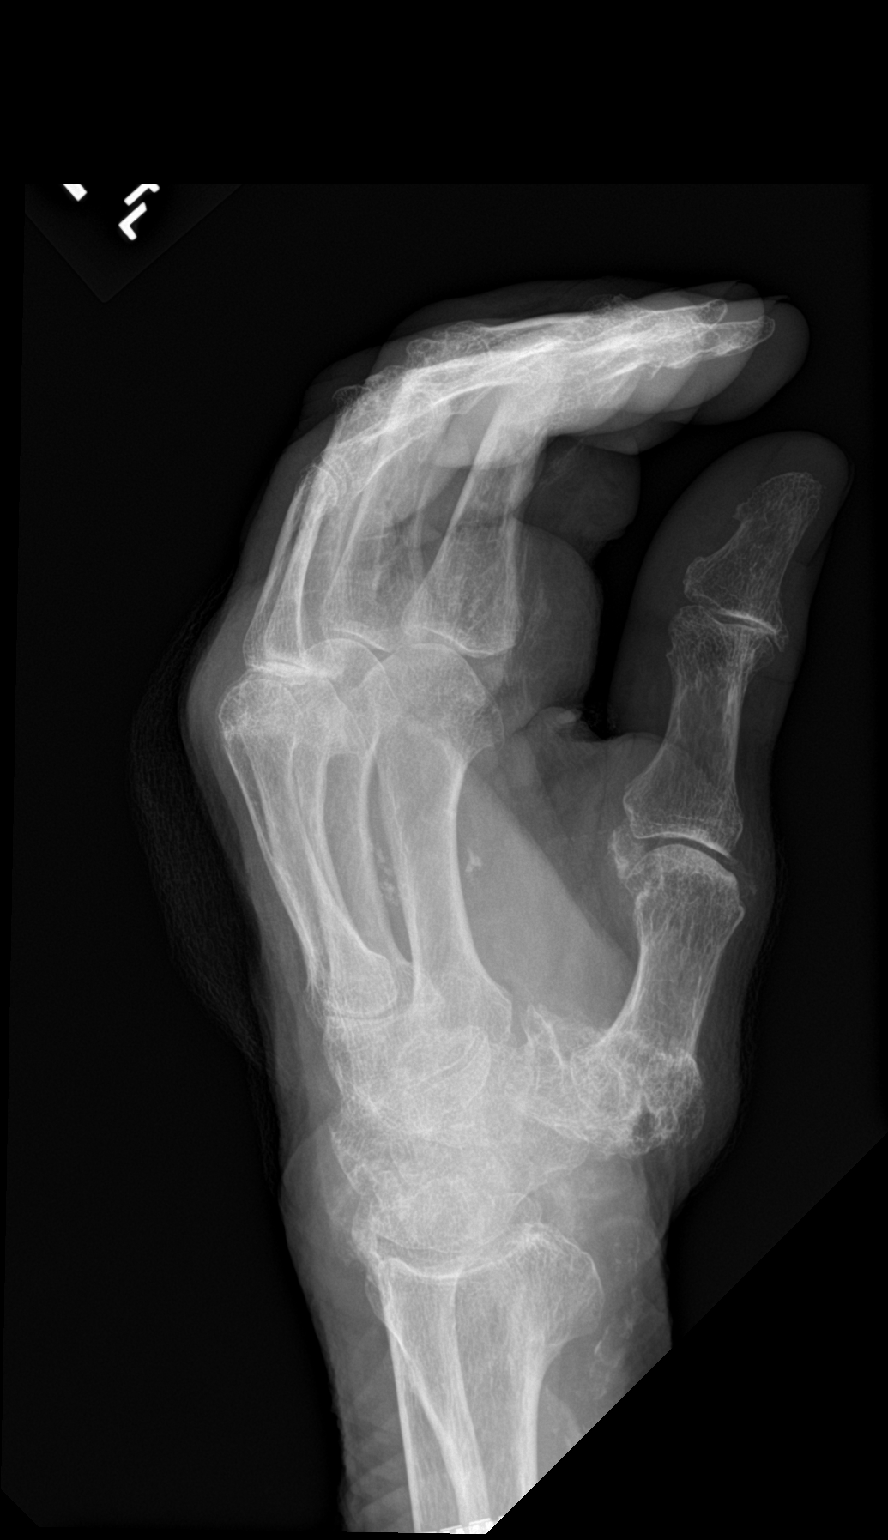

[hand ap]
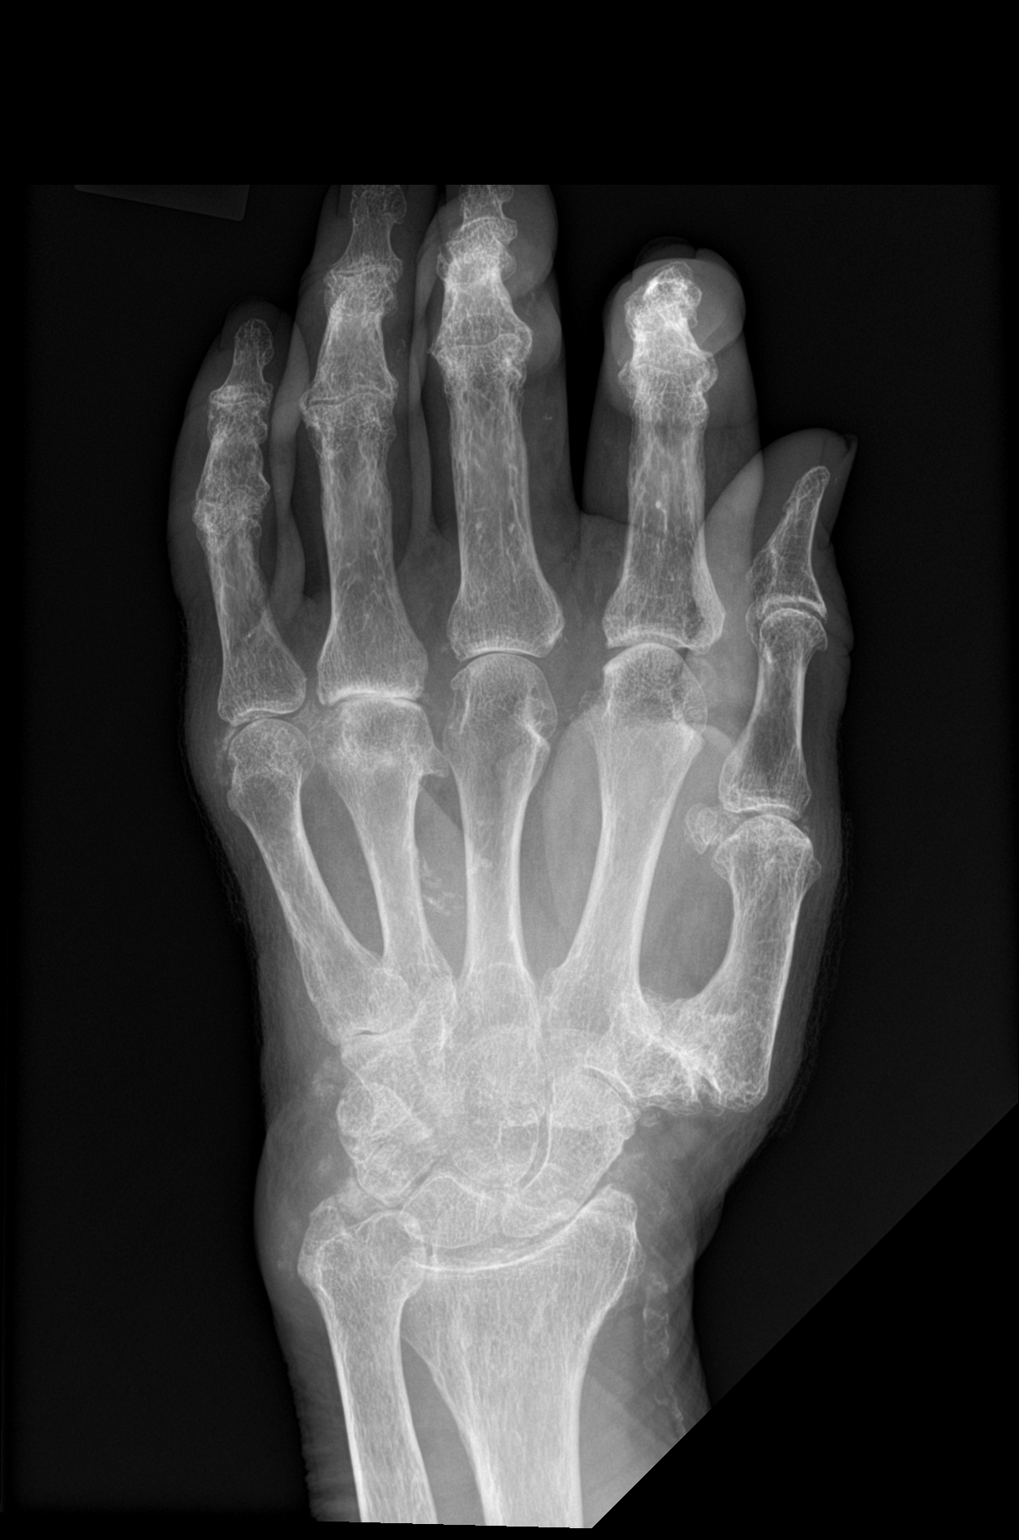

[3 of 3 positions shown; findings below may reference images not displayed]

FINDINGS: Again noted are diffuse degenerative changes throughout the left
hand without acute fracture or dislocation. Severe osteoarthritis at
the first carpometacarpal joint and the fourth MCP joint. Ulnar
positive variance and similar to the previous examination. Diffuse
vascular calcifications.
IMPRESSION: Extensive osteoarthritis in the left hand.  No acute abnormality.
